# Patient Record
Sex: Male | Born: 1990 | Race: Black or African American | Hispanic: No | Marital: Single
Health system: Southern US, Community
[De-identification: ages and names within clinical notes are randomized; demographics above are authoritative.]

## PROBLEM LIST (undated history)

## (undated) DIAGNOSIS — Z789 Other specified health status: Secondary | ICD-10-CM

---
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
## 2016-11-07 ENCOUNTER — Emergency Department (HOSPITAL_COMMUNITY)
Admission: EM | Admit: 2016-11-07 | Discharge: 2016-11-07 | Disposition: A | Discharge status: Home / Self Care | Payer: Self-pay | Attending: Emergency Medicine | Admitting: Emergency Medicine

## 2016-11-07 ENCOUNTER — Encounter (HOSPITAL_COMMUNITY): Payer: Self-pay

## 2016-11-07 DIAGNOSIS — J029 Acute pharyngitis, unspecified: Secondary | ICD-10-CM | POA: Insufficient documentation

## 2016-11-07 DIAGNOSIS — Z5321 Procedure and treatment not carried out due to patient leaving prior to being seen by health care provider: Secondary | ICD-10-CM | POA: Insufficient documentation

## 2016-11-07 LAB — RAPID STREP SCREEN (MED CTR MEBANE ONLY): Streptococcus, Group A Screen (Direct): NEGATIVE

## 2016-11-07 MED ORDER — CYCLOBENZAPRINE HCL 10 MG PO TABS: 10.0000 mg | ORAL_TABLET | Freq: Once | ORAL | Status: DC

## 2016-11-07 NOTE — ED Triage Notes (Signed)
Pt complains of a sore throat and earache for about three days

## 2016-11-07 NOTE — ED Provider Notes (Cosign Needed)
Went in exam room to see the patient and he was gone.   BP 118/74 (BP Location: Left Arm)   Pulse 72   Temp 99 F (37.2 C) (Oral)   Resp 18   Ht  (1.753 m)   Wt 83.1 kg (183 lb 3.2 oz)   SpO2 99%   BMI 27.05 kg/m    Results for orders placed or performed during the hospital encounter of 11/07/16 (from the past 24 hour(s))  Rapid strep screen     Status: None   Collection Time: 11/07/16  9:08 PM  Result Value Ref Range   Streptococcus, Group A Screen (Direct) NEGATIVE NEGATIVE      Kerrie Buffalo Bell, NP 11/07/16 2301

## 2016-11-10 LAB — CULTURE, GROUP A STREP (THRC)

## 2017-09-20 ENCOUNTER — Emergency Department (HOSPITAL_COMMUNITY): Payer: Self-pay

## 2017-09-20 ENCOUNTER — Encounter (HOSPITAL_COMMUNITY): Payer: Self-pay

## 2017-09-20 ENCOUNTER — Emergency Department (HOSPITAL_COMMUNITY)
Admission: EM | Admit: 2017-09-20 | Discharge: 2017-09-20 | Disposition: A | Discharge status: Home / Self Care | Payer: Self-pay | Attending: Emergency Medicine | Admitting: Emergency Medicine

## 2017-09-20 DIAGNOSIS — S62364A Nondisplaced fracture of neck of fourth metacarpal bone, right hand, initial encounter for closed fracture: Secondary | ICD-10-CM

## 2017-09-20 DIAGNOSIS — Y9389 Activity, other specified: Secondary | ICD-10-CM | POA: Insufficient documentation

## 2017-09-20 DIAGNOSIS — S62144A Nondisplaced fracture of body of hamate [unciform] bone, right wrist, initial encounter for closed fracture: Secondary | ICD-10-CM | POA: Insufficient documentation

## 2017-09-20 DIAGNOSIS — W2209XA Striking against other stationary object, initial encounter: Secondary | ICD-10-CM | POA: Insufficient documentation

## 2017-09-20 DIAGNOSIS — S62365A Nondisplaced fracture of neck of fourth metacarpal bone, left hand, initial encounter for closed fracture: Secondary | ICD-10-CM | POA: Insufficient documentation

## 2017-09-20 DIAGNOSIS — Y999 Unspecified external cause status: Secondary | ICD-10-CM | POA: Insufficient documentation

## 2017-09-20 DIAGNOSIS — F17200 Nicotine dependence, unspecified, uncomplicated: Secondary | ICD-10-CM | POA: Insufficient documentation

## 2017-09-20 DIAGNOSIS — Y9289 Other specified places as the place of occurrence of the external cause: Secondary | ICD-10-CM | POA: Insufficient documentation

## 2017-09-20 MED ORDER — IBUPROFEN 800 MG PO TABS
800.0000 mg | ORAL_TABLET | Freq: Once | ORAL | Status: AC
  Administered 2017-09-20: 800 mg via ORAL
  Filled 2017-09-20: qty 1

## 2017-09-20 NOTE — ED Triage Notes (Signed)
Pt called from triage with no answer and pt not seen in waiting room

## 2017-09-20 NOTE — ED Notes (Signed)
Ice applied and hand elevated.
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
## 2017-09-20 NOTE — ED Notes (Signed)
Called Ortho Tech @0332 

## 2017-09-20 NOTE — ED Notes (Signed)
Patient transported to X-ray 

## 2017-09-20 NOTE — Progress Notes (Signed)
Orthopedic Tech Progress Note Patient Details:  Scott Banks 1991-01-01 [id number]  Ortho Devices Type of Ortho Device: Ulna gutter splint Ortho Device/Splint Location: rue Ortho Device/Splint Interventions: Ordered, Application, Adjustment   Post Interventions Patient Tolerated: Well Instructions Provided: Care of device, Adjustment of device   Trinna PostMartinez, Treg Diemer J 09/20/2017, 4:17 AM

## 2017-09-20 NOTE — ED Notes (Signed)
Pt requesting pain medication.  

## 2017-09-20 NOTE — Discharge Instructions (Addendum)
1. Medications: alternate naprosyn and tylenol for pain control, usual home medications 2. Treatment: rest, ice, elevate and use brace, drink plenty of fluids, gentle stretching 3. Follow Up: Please followup with orthopedics as directed for discussion of your diagnoses and further evaluation after today's visit; if you do not have a primary care doctor use the resource guide provided to find one; Please return to the ER for worsening symptoms or other concerns  
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
## 2017-09-28 ENCOUNTER — Other Ambulatory Visit: Payer: Self-pay

## 2017-09-28 ENCOUNTER — Emergency Department (HOSPITAL_COMMUNITY)
Admission: EM | Admit: 2017-09-28 | Discharge: 2017-09-28 | Disposition: A | Discharge status: Home / Self Care | Payer: Self-pay | Attending: Emergency Medicine | Admitting: Emergency Medicine

## 2017-09-28 ENCOUNTER — Encounter (HOSPITAL_COMMUNITY): Payer: Self-pay

## 2017-09-28 DIAGNOSIS — K047 Periapical abscess without sinus: Secondary | ICD-10-CM | POA: Insufficient documentation

## 2017-09-28 DIAGNOSIS — F17210 Nicotine dependence, cigarettes, uncomplicated: Secondary | ICD-10-CM | POA: Insufficient documentation

## 2017-09-28 DIAGNOSIS — K029 Dental caries, unspecified: Secondary | ICD-10-CM | POA: Insufficient documentation

## 2017-09-28 MED ORDER — IBUPROFEN 800 MG PO TABS
800.0000 mg | ORAL_TABLET | Freq: Once | ORAL | Status: AC
  Administered 2017-09-28: 800 mg via ORAL
  Filled 2017-09-28: qty 1

## 2017-09-28 MED ORDER — HYDROCODONE-ACETAMINOPHEN 5-325 MG PO TABS
2.0000 | ORAL_TABLET | Freq: Once | ORAL | Status: AC
  Administered 2017-09-28: 2 via ORAL
  Filled 2017-09-28: qty 2

## 2017-09-28 MED ORDER — HYDROCODONE-ACETAMINOPHEN 5-325 MG PO TABS: 1.0000 | ORAL_TABLET | Freq: Four times a day (QID) | ORAL | 0 refills | Status: AC | PRN

## 2017-09-28 MED ORDER — AMOXICILLIN 500 MG PO TABS: 1000.0000 mg | ORAL_TABLET | Freq: Two times a day (BID) | ORAL | 0 refills | Status: AC

## 2017-09-28 MED ORDER — IBUPROFEN 800 MG PO TABS: 800.0000 mg | ORAL_TABLET | Freq: Three times a day (TID) | ORAL | 0 refills | Status: DC

## 2017-09-28 MED ORDER — IBUPROFEN 800 MG PO TABS: 800.0000 mg | ORAL_TABLET | Freq: Three times a day (TID) | ORAL | 0 refills | Status: AC

## 2017-09-28 MED ORDER — AMOXICILLIN 500 MG PO CAPS
1000.0000 mg | ORAL_CAPSULE | Freq: Once | ORAL | Status: AC
  Administered 2017-09-28: 1000 mg via ORAL
  Filled 2017-09-28: qty 2

## 2017-09-28 NOTE — ED Provider Notes (Signed)
[facility]-EMERGENCY DEPT Provider Note   CSN: [id number] Arrival date & time: 09/28/17  1935     History   Chief Complaint Chief Complaint  Patient presents with  . Dental Pain    HPI Scott Banks is a 27 y.o. male.  HPI Patient struggling pain in the left upper jaw yesterday.  He reports he knew he needs to see a dentist but things were okay.  Yesterday however his tooth became exquisitely sensitive and just ear moving over it is very painful.  He also has a painful to pressure on his cheek bone on that side.  No fevers, no chills, no sore throat, no difficulty breathing. History reviewed. No pertinent past medical history.  There are no active problems to display for this patient.   History reviewed. No pertinent surgical history.      Home Medications    Prior to Admission medications   Medication Sig Start Date End Date Taking? Authorizing Provider  amoxicillin (AMOXIL) 500 MG tablet Take 2 tablets (1,000 mg total) by mouth 2 (two) times daily. 09/28/17   Arby BarrettePfeiffer, Leshea Jaggers, MD  HYDROcodone-acetaminophen (NORCO/VICODIN) 5-325 MG tablet Take 1-2 tablets by mouth every 6 (six) hours as needed for severe pain. 09/28/17   Arby BarrettePfeiffer, Phoebie Shad, MD  ibuprofen (ADVIL,MOTRIN) 800 MG tablet Take 1 tablet (800 mg total) by mouth 3 (three) times daily. 09/28/17   Arby BarrettePfeiffer, Kristen Fromm, MD    Family History History reviewed. No pertinent family history.  Social History Social History   Tobacco Use  . Smoking status: Current Every Day Smoker  . Smokeless tobacco: Never Used  Substance Use Topics  . Alcohol use: No  . Drug use: No     Allergies   Patient has no known allergies.   Review of Systems Review of Systems Constitutional: No fever no chills no malaise ENT: No nasal congestion sore throat or earache  Physical Exam Updated Vital Signs BP 122/81   Pulse 68   Temp 98.3 F (36.8 C) (Oral)   Resp 18   SpO2 100%   Physical Exam  Constitutional:  He is oriented to person, place, and time. He appears well-developed and well-nourished.  Patient is clinically well in appearance.  He is alert and nontoxic.  HENT:  No obvious facial swelling to direct visual inspection.  Patient does have some tenderness along the zygoma on the left face.  Patient has decayed first upper molar on the left.  This has partial fracture.  No active drainage or discharge.  Other dentition is in fair condition but does have plaque and mild gingivitis.  Posterior oropharynx widely patent.  Eyes: EOM are normal.  Neck: Neck supple.  Pulmonary/Chest: Effort normal.  Musculoskeletal:  Patient is wearing a splint on his right hand.  Neurological: He is alert and oriented to person, place, and time. No cranial nerve deficit. He exhibits normal muscle tone. Coordination normal.  Skin: Skin is warm and dry.  Psychiatric: He has a normal mood and affect.     ED Treatments / Results  Labs (all labs ordered are listed, but only abnormal results are displayed) Labs Reviewed - No data to display  EKG None  Radiology No results found.  Procedures Procedures (including critical care time)  Medications Ordered in ED Medications  amoxicillin (AMOXIL) capsule 1,000 mg (has no administration in time range)  HYDROcodone-acetaminophen (NORCO/VICODIN) 5-325 MG per tablet 2 tablet (has no administration in time range)  ibuprofen (ADVIL,MOTRIN) tablet 800 mg (has no administration  in time range)     Initial Impression / Assessment and Plan / ED Course  I have reviewed the triage vital signs and the nursing notes.  Pertinent labs & imaging results that were available during my care of the patient were reviewed by me and considered in my medical decision making (see chart for details).      Final Clinical Impressions(s) / ED Diagnoses   Final diagnoses:  Pain due to dental caries  Apical abscess  No facial swelling at this time.  Patient however does have  tenderness on the zygoma suggestive of probable apical abscess.  He does have an isolated dental fracture due to decay in the upper left first molar.  Otherwise neck is supple.  No trismus.  He is clinically well in appearance.  Patient is given resources for dental follow-up he is aware he needs to make an appointment as soon as possible.  Started on amoxicillin, Motrin and Vicodin.  ED Discharge Orders         Ordered    amoxicillin (AMOXIL) 500 MG tablet  2 times daily     09/28/17 2319    ibuprofen (ADVIL,MOTRIN) 800 MG tablet  3 times daily,   Status:  Discontinued     09/28/17 2319    HYDROcodone-acetaminophen (NORCO/VICODIN) 5-325 MG tablet  Every 6 hours PRN     09/28/17 2319    ibuprofen (ADVIL,MOTRIN) 800 MG tablet  3 times daily     09/28/17 2322           Arby Barrette, MD 09/28/17 2326

## 2017-09-28 NOTE — Discharge Instructions (Addendum)
1.  Use the dental resource guide to find a dentist as soon as possible. 2.  Try to use good dental hygiene.  After brushing and flossing, swish your mouth with half hydrogen peroxide half water twice daily. 3.Return to the emergency department if you develop large facial swelling, fever, significantly worsening pain.

## 2017-09-28 NOTE — ED Triage Notes (Signed)
Pt reports dental pain starting yesterday. He reports trying oragel without relief. A&Ox4. Ambulatory.

## 2017-09-28 NOTE — ED Notes (Signed)
Pt tried to leave AMA, girlfriend talked him out of it. Pt very agitated and hostile.

## 2018-03-19 ENCOUNTER — Other Ambulatory Visit: Payer: Self-pay

## 2018-03-19 ENCOUNTER — Encounter (HOSPITAL_BASED_OUTPATIENT_CLINIC_OR_DEPARTMENT_OTHER): Payer: Self-pay | Admitting: Emergency Medicine

## 2018-03-19 ENCOUNTER — Emergency Department (HOSPITAL_BASED_OUTPATIENT_CLINIC_OR_DEPARTMENT_OTHER)
Admission: EM | Admit: 2018-03-19 | Discharge: 2018-03-19 | Disposition: A | Discharge status: Home / Self Care | Payer: Self-pay | Attending: Emergency Medicine | Admitting: Emergency Medicine

## 2018-03-19 DIAGNOSIS — K0889 Other specified disorders of teeth and supporting structures: Secondary | ICD-10-CM | POA: Insufficient documentation

## 2018-03-19 DIAGNOSIS — F17200 Nicotine dependence, unspecified, uncomplicated: Secondary | ICD-10-CM | POA: Insufficient documentation

## 2018-03-19 DIAGNOSIS — H60501 Unspecified acute noninfective otitis externa, right ear: Secondary | ICD-10-CM | POA: Insufficient documentation

## 2018-03-19 MED ORDER — AMOXICILLIN-POT CLAVULANATE 875-125 MG PO TABS: 1.0000 | ORAL_TABLET | Freq: Two times a day (BID) | ORAL | 0 refills | Status: AC

## 2018-03-19 MED ORDER — NEOMYCIN-POLYMYXIN-HC 3.5-10000-1 OT SUSP: 4.0000 [drp] | Freq: Three times a day (TID) | OTIC | 0 refills | Status: AC

## 2018-03-19 MED ORDER — NAPROXEN 500 MG PO TABS: 500.0000 mg | ORAL_TABLET | Freq: Two times a day (BID) | ORAL | 0 refills | Status: AC

## 2018-03-19 NOTE — ED Triage Notes (Signed)
Reports right ear pain x 1 week.

## 2018-03-19 NOTE — Discharge Instructions (Addendum)
Regarding your potential swimmer's ear we are placing you on antibiotic eardrops.  Please Place 4 drops in the right ear 3 times per day for the next 7 to 10 days.  Call one of the dentists offices provided to schedule an appointment for re-evaluation and further management within the next 48 hours.   I have prescribed you Augmentin which is an antibiotic to treat the infection and Naproxen which is an anti-inflammatory medicine to treat the pain.   Please take all of your antibiotics until finished. You may develop abdominal discomfort or diarrhea from the antibiotic.  You may help offset this with probiotics which you can buy at the store (ask your pharmacist if unable to find) or get probiotics in the form of eating yogurt. Do not eat or take the probiotics until 2 hours after your antibiotic. If you are unable to tolerate these side effects follow-up with your primary care provider or return to the emergency department.   If you begin to experience any blistering, rashes, swelling, or difficulty breathing seek medical care for evaluation of potentially more serious side effects.   Be sure to eat something when taking the Naproxen as it can cause stomach upset and at worst stomach bleeding. Do not take additional non steroidal anti-inflammatory medicines such as Ibuprofen, Aleve, Advil, Mobic, Diclofenac, or goodie powder while taking Naproxen. You may supplement with Tylenol.   We have prescribed you new medication(s) today. Discuss the medications prescribed today with your pharmacist as they can have adverse effects and interactions with your other medicines including over the counter and prescribed medications. Seek medical evaluation if you start to experience new or abnormal symptoms after taking one of these medicines, seek care immediately if you start to experience difficulty breathing, feeling of your throat closing, facial swelling, or rash as these could be indications of a more serious  allergic reaction  If you start to experience and new or worsening symptoms return to the emergency department. If you start to experience fever, chills, neck stiffness/pain, or inability to move your neck or open your mouth come back to the emergency department immediately.   Please follow-up with primary care within 1 week for reassessment of all of your symptoms in general.  If you do not have a primary care provider you may see our [facility] or call the attached circled phone number.  Return to the ER for new or worsening symptoms or any other concerns.

## 2018-03-19 NOTE — ED Provider Notes (Signed)
[facility] EMERGENCY DEPARTMENT Provider Note   CSN: [id number] Arrival date & time: 03/19/18  1412     History   Chief Complaint Chief Complaint  Patient presents with  . Otalgia    HPI Scott Banks is a 28 y.o. male with a history of tobacco abuse who presents to the emergency department with complaints of right ear pain and dental pain for the past 1 week.  Patient states that he thought that he had some content in his ear therefore he placed hydrogen peroxide in the EAC without relief.  He states that since this he has developed some pain and irritation to the right ear canal.  Not any purulent drainage.  He states however that the symptoms feel consistent with prior swimmer's ear.  Other than the peroxide in the ear he has not placed anything else into the canal.  He has not been swimming in a lake/pool/hot tub.  He states that with the ear discomfort he is also noticed intermittent right lower jaw dental pain.  He has a tooth this area that he has had issues with in the past.  Noted any purulent drainage from the tooth, swelling beneath the tongue, difficulty swallowing, difficulty breathing, neck stiffness, or vomiting.  Denies fever or chills.  HPI  History reviewed. No pertinent past medical history.  There are no active problems to display for this patient.   History reviewed. No pertinent surgical history.      Home Medications    Prior to Admission medications   Medication Sig Start Date End Date Taking? Authorizing Provider  amoxicillin (AMOXIL) 500 MG tablet Take 2 tablets (1,000 mg total) by mouth 2 (two) times daily. 09/28/17   Arby Barrette, MD  HYDROcodone-acetaminophen (NORCO/VICODIN) 5-325 MG tablet Take 1-2 tablets by mouth every 6 (six) hours as needed for severe pain. 09/28/17   Arby Barrette, MD  ibuprofen (ADVIL,MOTRIN) 800 MG tablet Take 1 tablet (800 mg total) by mouth 3 (three) times daily. 09/28/17   Arby Barrette, MD    Family  History History reviewed. No pertinent family history.  Social History Social History   Tobacco Use  . Smoking status: Current Every Day Smoker  . Smokeless tobacco: Never Used  Substance Use Topics  . Alcohol use: No  . Drug use: No     Allergies   Patient has no known allergies.   Review of Systems Review of Systems  Constitutional: Negative for chills and fever.  HENT: Positive for dental problem and ear pain. Negative for ear discharge, hearing loss, sore throat, trouble swallowing and voice change.   Respiratory: Negative for shortness of breath.   Cardiovascular: Negative for chest pain.  Gastrointestinal: Negative for abdominal pain and vomiting.  Musculoskeletal: Negative for neck pain.     Physical Exam Updated Vital Signs BP 122/82 (BP Location: Left Arm)   Pulse 71   Temp 98 F (36.7 C) (Oral)   Resp 18   Ht 5\' 10"  (1.778 m)   Wt 79.4 kg   SpO2 100%   BMI 25.11 kg/m   Physical Exam Vitals signs and nursing note reviewed.  Constitutional:      General: He is not in acute distress.    Appearance: He is well-developed. He is not toxic-appearing.  HENT:     Head: Normocephalic and atraumatic.     Right Ear: Tympanic membrane is not perforated, erythematous, retracted or bulging.     Left Ear: Tympanic membrane is not perforated, erythematous, retracted  or bulging.     Ears:     Comments: No mastoid erythema/warmth/swelling/tenderness.  Non-obstructing cerumen present in bilateral EACs.  R EAC is mildly erythematous. NO purulent drainage. No open wounds. Some pain with palpation of the R tragus.     Nose: Nose normal.     Mouth/Throat:     Pharynx: Uvula midline. No oropharyngeal exudate or posterior oropharyngeal erythema.      Comments: Posterior oropharynx is symmetric appearing. Patient tolerating own secretions without difficulty. No trismus. No drooling. No hot potato voice. No swelling beneath the tongue, submandibular compartment is soft.    Eyes:     General:        Right eye: No discharge.        Left eye: No discharge.     Conjunctiva/sclera: Conjunctivae normal.  Neck:     Musculoskeletal: Normal range of motion and neck supple. No neck rigidity.  Cardiovascular:     Rate and Rhythm: Normal rate and regular rhythm.  Pulmonary:     Effort: Pulmonary effort is normal.     Breath sounds: Normal breath sounds.  Lymphadenopathy:     Cervical: No cervical adenopathy.  Neurological:     Mental Status: He is alert.  Psychiatric:        Behavior: Behavior normal.        Thought Content: Thought content normal.      ED Treatments / Results  Labs (all labs ordered are listed, but only abnormal results are displayed) Labs Reviewed - No data to display  EKG None  Radiology No results found.  Procedures Procedures (including critical care time)  Medications Ordered in ED Medications - No data to display   Initial Impression / Assessment and Plan / ED Course  I have reviewed the triage vital signs and the nursing notes.  Pertinent labs & imaging results that were available during my care of the patient were reviewed by me and considered in my medical decision making (see chart for details).    Patient presents with R ear pain & dental pain. Patient is nontoxic appearing, vitals without significant abnormality.   Regarding ear pain: Some cerumen present in R EAC, the canal is mildly erythematous, not overly swollen, no open wounds appreciated, no purulent drainage- not classic for AOE, however feel reasonable to cover w/ abx drops, not consistent with malignant otitis externa, AOM, mastoiditis, or cellulitis. PCP follow up.  Regarding dental pain: No gross abscess.  Exam unconcerning for Ludwig's angina or spread of infection.  Will treat with Augmentin and Naproxen.  Urged patient to follow-up with dentist, dental resources were provided.    Discussed treatment plan and need for follow up as well as return  precautions. Provided opportunity for questions, patient confirmed understanding and is agreeable to plan.   Final Clinical Impressions(s) / ED Diagnoses   Final diagnoses:  Acute otitis externa of right ear, unspecified type  Pain, dental    ED Discharge Orders         Ordered    neomycin-polymyxin-hydrocortisone (CORTISPORIN) 3.5-10000-1 OTIC suspension  3 times daily     03/19/18 1631    amoxicillin-clavulanate (AUGMENTIN) 875-125 MG tablet  Every 12 hours     03/19/18 1631    naproxen (NAPROSYN) 500 MG tablet  2 times daily     03/19/18 1632           Rachelle Edwards, [place] R, PA-C 03/19/18 1640    Maia Plan, MD 03/20/18 1042

## 2018-03-19 NOTE — ED Notes (Signed)
Nurse first-pt seated in ED WR-phone in hand-NAD 

## 2019-01-01 ENCOUNTER — Inpatient Hospital Stay (HOSPITAL_COMMUNITY)
Admission: EM | Admit: 2019-01-01 | Discharge: 2019-01-03 | DRG: 604 | Disposition: A | Discharge status: Home / Self Care | Payer: Self-pay | Attending: General Surgery | Admitting: General Surgery

## 2019-01-01 ENCOUNTER — Emergency Department (HOSPITAL_COMMUNITY): Payer: Self-pay

## 2019-01-01 ENCOUNTER — Other Ambulatory Visit: Payer: Self-pay

## 2019-01-01 ENCOUNTER — Encounter (HOSPITAL_COMMUNITY): Payer: Self-pay | Admitting: Emergency Medicine

## 2019-01-01 DIAGNOSIS — S32301B Unspecified fracture of right ilium, initial encounter for open fracture: Secondary | ICD-10-CM | POA: Diagnosis present

## 2019-01-01 DIAGNOSIS — Z20828 Contact with and (suspected) exposure to other viral communicable diseases: Secondary | ICD-10-CM | POA: Diagnosis present

## 2019-01-01 DIAGNOSIS — W3400XA Accidental discharge from unspecified firearms or gun, initial encounter: Secondary | ICD-10-CM

## 2019-01-01 DIAGNOSIS — S31814A Puncture wound with foreign body of right buttock, initial encounter: Principal | ICD-10-CM | POA: Diagnosis present

## 2019-01-01 DIAGNOSIS — S31139A Puncture wound of abdominal wall without foreign body, unspecified quadrant without penetration into peritoneal cavity, initial encounter: Secondary | ICD-10-CM

## 2019-01-01 DIAGNOSIS — S31133A Puncture wound of abdominal wall without foreign body, right lower quadrant without penetration into peritoneal cavity, initial encounter: Secondary | ICD-10-CM | POA: Diagnosis present

## 2019-01-01 DIAGNOSIS — F17210 Nicotine dependence, cigarettes, uncomplicated: Secondary | ICD-10-CM | POA: Diagnosis present

## 2019-01-01 HISTORY — DX: Other specified health status: Z78.9

## 2019-01-01 LAB — SARS CORONAVIRUS 2 BY RT PCR (HOSPITAL ORDER, PERFORMED IN ~~LOC~~ HOSPITAL LAB): SARS Coronavirus 2: NEGATIVE

## 2019-01-01 LAB — I-STAT CHEM 8, ED
BUN: 12 mg/dL (ref 6–20)
Calcium, Ion: 1.06 mmol/L — ABNORMAL LOW (ref 1.15–1.40)
Chloride: 105 mmol/L (ref 98–111)
Creatinine, Ser: 0.9 mg/dL (ref 0.61–1.24)
Glucose, Bld: 162 mg/dL — ABNORMAL HIGH (ref 70–99)
HCT: 46 % (ref 39.0–52.0)
Hemoglobin: 15.6 g/dL (ref 13.0–17.0)
Potassium: 4.2 mmol/L (ref 3.5–5.1)
Sodium: 136 mmol/L (ref 135–145)
TCO2: 22 mmol/L (ref 22–32)

## 2019-01-01 LAB — COMPREHENSIVE METABOLIC PANEL
ALT: 21 U/L (ref 0–44)
AST: 31 U/L (ref 15–41)
Albumin: 4.1 g/dL (ref 3.5–5.0)
Alkaline Phosphatase: 79 U/L (ref 38–126)
Anion gap: 12 (ref 5–15)
BUN: 10 mg/dL (ref 6–20)
CO2: 20 mmol/L — ABNORMAL LOW (ref 22–32)
Calcium: 9.6 mg/dL (ref 8.9–10.3)
Chloride: 103 mmol/L (ref 98–111)
Creatinine, Ser: 1.15 mg/dL (ref 0.61–1.24)
GFR calc Af Amer: >60 mL/min (ref >60)
GFR calc non Af Amer: >60 mL/min (ref >60)
Glucose, Bld: 167 mg/dL — ABNORMAL HIGH (ref 70–99)
Potassium: 4.3 mmol/L (ref 3.5–5.1)
Sodium: 135 mmol/L (ref 135–145)
Total Bilirubin: 1 mg/dL (ref 0.3–1.2)
Total Protein: 8.5 g/dL — ABNORMAL HIGH (ref 6.5–8.1)

## 2019-01-01 LAB — CBC
HCT: 43.3 % (ref 39.0–52.0)
Hemoglobin: 14.6 g/dL (ref 13.0–17.0)
MCH: 31.9 pg (ref 26.0–34.0)
MCHC: 33.7 g/dL (ref 30.0–36.0)
MCV: 94.7 fL (ref 80.0–100.0)
Platelets: 491 10*3/uL — ABNORMAL HIGH (ref 150–400)
RBC: 4.57 MIL/uL (ref 4.22–5.81)
RDW: 12.3 % (ref 11.5–15.5)
WBC: 7.9 10*3/uL (ref 4.0–10.5)
nRBC: 0 % (ref 0.0–0.2)

## 2019-01-01 LAB — PROTIME-INR
INR: 1 (ref 0.8–1.2)
Prothrombin Time: 12.9 seconds (ref 11.4–15.2)

## 2019-01-01 LAB — LACTIC ACID, PLASMA: Lactic Acid, Venous: 3.8 mmol/L (ref 0.5–1.9)

## 2019-01-01 LAB — SAMPLE TO BLOOD BANK

## 2019-01-01 LAB — POC SARS CORONAVIRUS 2 AG -  ED: SARS Coronavirus 2 Ag: NEGATIVE

## 2019-01-01 LAB — ETHANOL: Alcohol, Ethyl (B): <10 mg/dL (ref <10)

## 2019-01-01 MED ORDER — LIDOCAINE-EPINEPHRINE (PF) 2 %-1:200000 IJ SOLN
20.0000 mL | Freq: Once | INTRAMUSCULAR | Status: AC
  Administered 2019-01-01: 20 mL via INTRADERMAL
  Filled 2019-01-01: qty 20

## 2019-01-01 MED ORDER — POTASSIUM CHLORIDE IN NACL 20-0.9 MEQ/L-% IV SOLN
INTRAVENOUS | Status: DC
  Administered 2019-01-01 – 2019-01-03 (×3): via INTRAVENOUS
  Filled 2019-01-01 (×3): qty 1000

## 2019-01-01 MED ORDER — ACETAMINOPHEN 325 MG PO TABS: 650.0000 mg | ORAL_TABLET | ORAL | Status: DC | PRN

## 2019-01-01 MED ORDER — OXYCODONE HCL 5 MG PO TABS
10.0000 mg | ORAL_TABLET | ORAL | Status: DC | PRN
  Administered 2019-01-01 – 2019-01-03 (×5): 10 mg via ORAL
  Filled 2019-01-01 (×5): qty 2

## 2019-01-01 MED ORDER — FENTANYL CITRATE (PF) 100 MCG/2ML IJ SOLN
INTRAMUSCULAR | Status: AC
  Filled 2019-01-01: qty 2

## 2019-01-01 MED ORDER — HYDROMORPHONE HCL 1 MG/ML IJ SOLN: 1.0000 mg | INTRAMUSCULAR | Status: DC | PRN

## 2019-01-01 MED ORDER — CEFAZOLIN SODIUM-DEXTROSE 1-4 GM/50ML-% IV SOLN
1.0000 g | Freq: Three times a day (TID) | INTRAVENOUS | Status: DC
  Administered 2019-01-01 – 2019-01-03 (×5): 1 g via INTRAVENOUS
  Filled 2019-01-01 (×7): qty 50

## 2019-01-01 MED ORDER — ONDANSETRON 4 MG PO TBDP: 4.0000 mg | ORAL_TABLET | Freq: Four times a day (QID) | ORAL | Status: DC | PRN

## 2019-01-01 MED ORDER — FENTANYL CITRATE (PF) 100 MCG/2ML IJ SOLN
INTRAMUSCULAR | Status: AC | PRN
  Administered 2019-01-01: 50 ug via INTRAVENOUS

## 2019-01-01 MED ORDER — IOHEXOL 300 MG/ML  SOLN
100.0000 mL | Freq: Once | INTRAMUSCULAR | Status: AC | PRN
  Administered 2019-01-01: 100 mL via INTRAVENOUS

## 2019-01-01 MED ORDER — ONDANSETRON HCL 4 MG/2ML IJ SOLN: 4.0000 mg | Freq: Four times a day (QID) | INTRAMUSCULAR | Status: DC | PRN

## 2019-01-01 MED ORDER — OXYCODONE HCL 5 MG PO TABS: 5.0000 mg | ORAL_TABLET | ORAL | Status: DC | PRN

## 2019-01-01 NOTE — Progress Notes (Signed)
Orthopedic Tech Progress Note Patient Details:  Scott Banks 11/16/1990 [id number] TRAUMA LEVEL 1 Patient ID: Scott Banks, male   DOB: 1990/03/24, 28 y.o.   MRN: [id number]   Staci Righter 01/01/2019, 8:41 PM
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
## 2019-01-02 LAB — BASIC METABOLIC PANEL
Anion gap: 13 (ref 5–15)
BUN: 8 mg/dL (ref 6–20)
CO2: 22 mmol/L (ref 22–32)
Calcium: 9.8 mg/dL (ref 8.9–10.3)
Chloride: 102 mmol/L (ref 98–111)
Creatinine, Ser: 0.84 mg/dL (ref 0.61–1.24)
GFR calc Af Amer: >60 mL/min (ref >60)
GFR calc non Af Amer: >60 mL/min (ref >60)
Glucose, Bld: 97 mg/dL (ref 70–99)
Potassium: 4 mmol/L (ref 3.5–5.1)
Sodium: 137 mmol/L (ref 135–145)

## 2019-01-02 LAB — CBC
HCT: 39.8 % (ref 39.0–52.0)
Hemoglobin: 13.5 g/dL (ref 13.0–17.0)
MCH: 31.7 pg (ref 26.0–34.0)
MCHC: 33.9 g/dL (ref 30.0–36.0)
MCV: 93.4 fL (ref 80.0–100.0)
Platelets: 441 10*3/uL — ABNORMAL HIGH (ref 150–400)
RBC: 4.26 MIL/uL (ref 4.22–5.81)
RDW: 12.2 % (ref 11.5–15.5)
WBC: 10 10*3/uL (ref 4.0–10.5)
nRBC: 0 % (ref 0.0–0.2)

## 2019-01-02 LAB — HIV ANTIBODY (ROUTINE TESTING W REFLEX): HIV Screen 4th Generation wRfx: NONREACTIVE

## 2019-01-02 MED ORDER — METHOCARBAMOL 750 MG PO TABS
750.0000 mg | ORAL_TABLET | Freq: Four times a day (QID) | ORAL | Status: DC | PRN
  Administered 2019-01-03: 750 mg via ORAL
  Filled 2019-01-02: qty 1

## 2019-01-02 MED ORDER — IBUPROFEN 600 MG PO TABS: 600.0000 mg | ORAL_TABLET | Freq: Four times a day (QID) | ORAL | Status: DC | PRN

## 2019-01-02 MED ORDER — ENOXAPARIN SODIUM 40 MG/0.4ML ~~LOC~~ SOLN
40.0000 mg | SUBCUTANEOUS | Status: DC
  Administered 2019-01-02 – 2019-01-03 (×2): 40 mg via SUBCUTANEOUS
  Filled 2019-01-02 (×2): qty 0.4

## 2019-01-02 NOTE — Plan of Care (Signed)
  Problem: Education: Goal: Knowledge of General Education information will improve Description: Including pain rating scale, medication(s)/side effects and non-pharmacologic comfort measures Outcome: Progressing   Problem: Coping: Goal: Level of anxiety will decrease Outcome: Progressing   
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
## 2019-01-03 MED ORDER — OXYCODONE HCL 5 MG PO TABS: 5.0000 mg | ORAL_TABLET | Freq: Four times a day (QID) | ORAL | 0 refills | Status: DC | PRN

## 2019-01-03 MED ORDER — OXYCODONE HCL 5 MG PO TABS: 5.0000 mg | ORAL_TABLET | Freq: Four times a day (QID) | ORAL | 0 refills | Status: AC | PRN

## 2019-01-03 MED ORDER — METHOCARBAMOL 750 MG PO TABS: 750.0000 mg | ORAL_TABLET | Freq: Four times a day (QID) | ORAL | 0 refills | Status: DC | PRN

## 2019-01-03 MED ORDER — METHOCARBAMOL 750 MG PO TABS: 750.0000 mg | ORAL_TABLET | Freq: Four times a day (QID) | ORAL | 0 refills | Status: AC | PRN

## 2019-01-03 MED ORDER — IBUPROFEN 600 MG PO TABS: 600.0000 mg | ORAL_TABLET | Freq: Three times a day (TID) | ORAL | 0 refills | Status: AC

## 2019-01-03 MED ORDER — IBUPROFEN 600 MG PO TABS: 600.0000 mg | ORAL_TABLET | Freq: Three times a day (TID) | ORAL | 0 refills | Status: DC

## 2019-01-03 MED ORDER — IBUPROFEN 600 MG PO TABS
600.0000 mg | ORAL_TABLET | Freq: Three times a day (TID) | ORAL | Status: DC
  Administered 2019-01-03: 600 mg via ORAL
  Filled 2019-01-03: qty 1

## 2019-01-03 NOTE — Progress Notes (Signed)
Pt refused dressing changes last night and this morning. Patient is severe pain to his right hip next to the wound per patient. Pain med and muscle relaxer given. Will monitor pt.
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
## 2019-01-03 NOTE — Progress Notes (Signed)
Discharged pt to home, instructions given and explained. [employer] letter received for prescription and rolling walker. Belongings returned.

## 2019-01-03 NOTE — TOC Transition Note (Addendum)
Transition of Care [facility]) - CM/SW Discharge Note   Patient Details  Name: Edu On MRN: [id number] Date of Birth: April 24, 1990  [facility]) CM/SW Contact:  Claudie Leach, RN Phone Number: [phone] 01/03/2019, 10:41 AM   Clinical Narrative:    RW ordered for delivery to room prior to d/c.   [place] letter given with override for narcotic.   Final next level of care: Home/Self Care      Discharge Plan and Services                DME Arranged: Walker rolling DME Agency: [employer] Date DME Agency Contacted: 01/03/19 Time DME Agency Contacted: 0768 Representative spoke with at DME Agency: Bertrum Sol

## 2019-01-03 NOTE — Discharge Instructions (Signed)
Crutch Mobility: Partial Weight Bearing    Grasp crutch handles securely. Do NOT lean armpits on crutches. 1.Move both crutches forward and slightly out to sides. 2.Step up to crutches with right leg. 3.Bearing weight on both hands, bring other leg forward. Repeat above sequence for each step taken.  Copyright  [employer]. All rights reserved.

## 2019-01-05 ENCOUNTER — Encounter (HOSPITAL_BASED_OUTPATIENT_CLINIC_OR_DEPARTMENT_OTHER): Payer: Self-pay | Admitting: Emergency Medicine
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
